# Patient Record
Sex: Female | Born: 2000 | Race: Black or African American | Hispanic: No | Marital: Single | Smoking: Never smoker
Health system: Southern US, Community
[De-identification: ages and names within clinical notes are randomized; demographics above are authoritative.]

## PROBLEM LIST (undated history)

## (undated) DIAGNOSIS — K219 Gastro-esophageal reflux disease without esophagitis: Secondary | ICD-10-CM

---
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
